# Patient Record
Sex: Male | Born: 1990 | Race: Black or African American | Hispanic: No | Marital: Single | Smoking: Current every day smoker
Health system: Southern US, Community
[De-identification: ages and names within clinical notes are randomized; demographics above are authoritative.]

---

## 2016-12-07 ENCOUNTER — Emergency Department (HOSPITAL_BASED_OUTPATIENT_CLINIC_OR_DEPARTMENT_OTHER): Payer: Self-pay

## 2016-12-07 ENCOUNTER — Encounter (HOSPITAL_BASED_OUTPATIENT_CLINIC_OR_DEPARTMENT_OTHER): Payer: Self-pay | Admitting: *Deleted

## 2016-12-07 ENCOUNTER — Emergency Department (HOSPITAL_BASED_OUTPATIENT_CLINIC_OR_DEPARTMENT_OTHER)
Admission: EM | Admit: 2016-12-07 | Discharge: 2016-12-07 | Disposition: A | Discharge status: Home / Self Care | Payer: Self-pay | Attending: Emergency Medicine | Admitting: Emergency Medicine

## 2016-12-07 DIAGNOSIS — R590 Localized enlarged lymph nodes: Secondary | ICD-10-CM

## 2016-12-07 DIAGNOSIS — R591 Generalized enlarged lymph nodes: Secondary | ICD-10-CM | POA: Insufficient documentation

## 2016-12-07 DIAGNOSIS — N50812 Left testicular pain: Secondary | ICD-10-CM | POA: Insufficient documentation

## 2016-12-07 DIAGNOSIS — F17200 Nicotine dependence, unspecified, uncomplicated: Secondary | ICD-10-CM | POA: Insufficient documentation

## 2016-12-07 LAB — CBC WITH DIFFERENTIAL/PLATELET
BASOS ABS: 0 10*3/uL (ref 0.0–0.1)
Basophils Relative: 0 %
EOS ABS: 0.2 10*3/uL (ref 0.0–0.7)
EOS PCT: 4 %
HCT: 42 % (ref 39.0–52.0)
HEMOGLOBIN: 14.5 g/dL (ref 13.0–17.0)
LYMPHS PCT: 29 %
Lymphs Abs: 1.9 10*3/uL (ref 0.7–4.0)
MCH: 28.6 pg (ref 26.0–34.0)
MCHC: 34.5 g/dL (ref 30.0–36.0)
MCV: 82.8 fL (ref 78.0–100.0)
Monocytes Absolute: 1.2 10*3/uL — ABNORMAL HIGH (ref 0.1–1.0)
Monocytes Relative: 18 %
Neutro Abs: 3.4 10*3/uL (ref 1.7–7.7)
Neutrophils Relative %: 49 %
PLATELETS: 190 10*3/uL (ref 150–400)
RBC: 5.07 MIL/uL (ref 4.22–5.81)
RDW: 13.1 % (ref 11.5–15.5)
WBC: 6.8 10*3/uL (ref 4.0–10.5)

## 2016-12-07 LAB — COMPREHENSIVE METABOLIC PANEL
ALT: 38 U/L (ref 17–63)
AST: 30 U/L (ref 15–41)
Albumin: 4.3 g/dL (ref 3.5–5.0)
Alkaline Phosphatase: 84 U/L (ref 38–126)
Anion gap: 12 (ref 5–15)
BUN: 11 mg/dL (ref 6–20)
CHLORIDE: 105 mmol/L (ref 101–111)
CO2: 20 mmol/L — ABNORMAL LOW (ref 22–32)
CREATININE: 0.82 mg/dL (ref 0.61–1.24)
Calcium: 9.2 mg/dL (ref 8.9–10.3)
GFR calc Af Amer: >60 mL/min (ref >60)
GFR calc non Af Amer: >60 mL/min (ref >60)
GLUCOSE: 82 mg/dL (ref 65–99)
Potassium: 3.9 mmol/L (ref 3.5–5.1)
SODIUM: 137 mmol/L (ref 135–145)
Total Bilirubin: 0.7 mg/dL (ref 0.3–1.2)
Total Protein: 8.1 g/dL (ref 6.5–8.1)

## 2016-12-07 LAB — URINALYSIS, ROUTINE W REFLEX MICROSCOPIC
BILIRUBIN URINE: NEGATIVE
GLUCOSE, UA: NEGATIVE mg/dL
Ketones, ur: NEGATIVE mg/dL
Leukocytes, UA: NEGATIVE
NITRITE: NEGATIVE
PH: 5.5 (ref 5.0–8.0)
Protein, ur: NEGATIVE mg/dL
SPECIFIC GRAVITY, URINE: 1.019 (ref 1.005–1.030)

## 2016-12-07 LAB — URINALYSIS, MICROSCOPIC (REFLEX): WBC UA: NONE SEEN WBC/hpf (ref 0–5)

## 2016-12-07 MED ORDER — CEFTRIAXONE SODIUM 250 MG IJ SOLR
250.0000 mg | Freq: Once | INTRAMUSCULAR | Status: AC
  Administered 2016-12-07: 250 mg via INTRAMUSCULAR
  Filled 2016-12-07: qty 250

## 2016-12-07 MED ORDER — IOPAMIDOL (ISOVUE-300) INJECTION 61%
100.0000 mL | Freq: Once | INTRAVENOUS | Status: AC | PRN
  Administered 2016-12-07: 100 mL via INTRAVENOUS

## 2016-12-07 MED ORDER — AZITHROMYCIN 250 MG PO TABS
1000.0000 mg | ORAL_TABLET | Freq: Once | ORAL | Status: AC
  Administered 2016-12-07: 1000 mg via ORAL
  Filled 2016-12-07: qty 4

## 2016-12-07 MED ORDER — KETOROLAC TROMETHAMINE 30 MG/ML IJ SOLN
30.0000 mg | Freq: Once | INTRAMUSCULAR | Status: AC
  Administered 2016-12-07: 30 mg via INTRAVENOUS
  Filled 2016-12-07: qty 1

## 2016-12-07 NOTE — ED Notes (Signed)
Patient transported to CT 

## 2016-12-07 NOTE — ED Provider Notes (Signed)
[facility]   CSN: [id number] Arrival date & time: 12/07/16  1732  By signing my name below, I, Joe Sanchez, attest that this documentation has been prepared under the direction and in the presence of Sharilyn SitesLisa Welford Christmas, PA-C. Electronically Signed: Angelene GiovanniEmmanuella Sanchez, ED Scribe. 12/07/16. 6:07 PM.   History   Chief Complaint Chief Complaint  Patient presents with  . Abdominal Pain    HPI Comments: Joe FootRodney Sanchez is a 25 y.o. male who presents to the Emergency Department complaining of gradually worsening moderate LLQ pain onset last night. He reports associated pain to his left testicle with a small mass which is where he believes his pain starts from. No alleviating or exacerbating factors noted. Pt has not tried any medications PTA. He has NKDA. He denies any concerns for an STD or hx of abdominal surgeries. He also denies any fever, chills, nausea, vomiting, urinary symptoms, penile discharge, generalized rash, or any other symptoms.   The history is provided by the patient. No language interpreter was used.    History reviewed. No pertinent past medical history.  There are no active problems to display for this patient.   History reviewed. No pertinent surgical history.     Home Medications    Prior to Admission medications   Not on File    Family History No family history on file.  Social History Social History  Substance Use Topics  . Smoking status: Current Every Day Smoker  . Smokeless tobacco: Never Used  . Alcohol use No     Allergies   Patient has no known allergies.   Review of Systems Review of Systems  Constitutional: Negative for chills and fever.  Gastrointestinal: Positive for abdominal pain. Negative for nausea and vomiting.  Genitourinary: Positive for testicular pain. Negative for discharge.  Skin: Negative for rash.  All other systems reviewed and are negative.    Physical Exam Updated Vital Signs BP  148/97   Pulse 110   Temp 98 F (36.7 C) (Oral)   Resp 16   Ht 5\' 6"  (1.676 m)   Wt 150 lb (68 kg)   SpO2 100%   BMI 24.21 kg/m   Physical Exam  Constitutional: He is oriented to person, place, and time. He appears well-developed and well-nourished.  HENT:  Head: Normocephalic and atraumatic.  Mouth/Throat: Oropharynx is clear and moist.  Eyes: Conjunctivae and EOM are normal. Pupils are equal, round, and reactive to light.  Neck: Normal range of motion.  Cardiovascular: Normal rate, regular rhythm and normal heart sounds.   Pulmonary/Chest: Effort normal and breath sounds normal.  Abdominal: Soft. Bowel sounds are normal. A hernia is present.  Genitourinary: Left testis shows tenderness. Circumcised. No penile erythema. No discharge found.  Genitourinary Comments: Some mild enlargement of the left pelvic region that is TTP, concerning for hernia; no appreciable lymphadenopathy; left testicle is mildly tender along lateral edge as well without visible swelling or mass; normal lie; no penile discharge noted  Musculoskeletal: Normal range of motion.  Neurological: He is alert and oriented to person, place, and time.  Skin: Skin is warm and dry.  Psychiatric: He has a normal mood and affect.  Nursing note and vitals reviewed.    ED Treatments / Results  DIAGNOSTIC STUDIES: Oxygen Saturation is 100% on RA, normal by my interpretation.    COORDINATION OF CARE: 6:03 PM- Pt advised of plan for treatment and pt agrees. Pt will receive Toradol. He will also receive US scrotum, US doppler,  and lab work for further evaluation.    Labs (all labs ordered are listed, but only abnormal results are displayed) Labs Reviewed  CBC WITH DIFFERENTIAL/PLATELET - Abnormal; Notable for the following:       Result Value   Monocytes Absolute 1.2 (*)    All other components within normal limits  COMPREHENSIVE METABOLIC PANEL - Abnormal; Notable for the following:    CO2 20 (*)    All other  components within normal limits  URINALYSIS, ROUTINE W REFLEX MICROSCOPIC - Abnormal; Notable for the following:    Hgb urine dipstick TRACE (*)    All other components within normal limits  URINALYSIS, MICROSCOPIC (REFLEX) - Abnormal; Notable for the following:    Bacteria, UA RARE (*)    Squamous Epithelial / LPF 0-5 (*)    All other components within normal limits  GC/CHLAMYDIA PROBE AMP ([place]) NOT AT [facility])    EKG  EKG Interpretation None       Radiology US Scrotum  Result Date: 12/07/2016 CLINICAL DATA:  Left inguinal canal pain and left testicle pain. EXAM: ULTRASOUND OF SCROTUM SCROTAL DOPPLER TECHNIQUE: Complete ultrasound examination of the testicles, epididymis, and other scrotal structures was performed. Doppler interrogation of the testicles was performed bile leak. COMPARISON:  None. FINDINGS: Right testicle Measurements: 4.4 x 2.0 x 2.7 cm. No mass or microlithiasis visualized. Left testicle Measurements: 4.0 x 2.2 x 2.7 cm. No mass or microlithiasis visualized. Right epididymis:  Normal in size and appearance. Left epididymis:  Normal in size and appearance. Hydrocele:  Small left hydrocele inferiorly Varicocele:  None visualized. Doppler: Normal low resistance arterial and venous blood flow noted bilaterally. Other: There is abnormal soft tissue within the left inguinal canal extending into the inferior left scrotum, most compatible with inguinal hernia. It is concerning that this contains bowel. IMPRESSION: Abnormal soft tissue within the left inguinal canal and left scrotum compatible with left inguinal hernia. Appearance is concerning for containing bowel. No testicular abnormality. Electronically Signed   By: Charlett Nose M.D.   On: 12/07/2016 18:59   Ct Abdomen Pelvis W Contrast  Result Date: 12/07/2016 CLINICAL DATA:  Left groin pain since last night. Abnormal soft tissue within the left inguinal canal and left scrotum on a scrotal ultrasound earlier today, with  a concern for a left inguinal hernia containing bowel. EXAM: CT ABDOMEN AND PELVIS WITH CONTRAST TECHNIQUE: Multidetector CT imaging of the abdomen and pelvis was performed using the standard protocol following bolus administration of intravenous contrast. CONTRAST:  ISOVUE-300 IOPAMIDOL (ISOVUE-300) INJECTION 61% COMPARISON:  None. FINDINGS: Lower chest: Minimal right middle lobe atelectasis or scarring. Hepatobiliary: Mild diffuse low density of the liver relative to the spleen. Normal appearing gallbladder. Pancreas: Unremarkable. No pancreatic ductal dilatation or surrounding inflammatory changes. Spleen: Normal in size without focal abnormality. Adrenals/Urinary Tract: Adrenal glands are unremarkable. Kidneys are normal, without renal calculi, focal lesion, or hydronephrosis. Bladder is unremarkable. Stomach/Bowel: Stomach is within normal limits. Appendix appears normal. No evidence of bowel wall thickening, distention, or inflammatory changes. Vascular/Lymphatic: Prominent right common iliac lymph node with a short axis diameter of 8 mm on image number 57 of series 2. Prominent right internal iliac lymph node with a short axis diameter of 9 mm on image number 66 of series 2. Prominent bilateral obturator nodes with a short axis diameter of 6 mm on the right and 6 mm on the left on image number 71 of series 2. Prominent bilateral common femoral nodes, with a short  axis diameter of 7 mm on the right and 6 mm on the left on image number 69 of series 2. Prominent bilateral inguinal lymph nodes, the largest with a short axis diameter of 8 mm on the right and 9 mm on the left on image number 82 of series 2. No vascular abnormalities. Reproductive: Normal sized prostate gland. Other: No inguinal hernia is demonstrated on either side. The scrotal contents appear normal, with the exception of mildly prominent vessels in the left scrotum and inguinal canal, without a discrete varicocele or mass identified.  Musculoskeletal: Normal appearing bones and soft tissues. IMPRESSION: 1. No inguinal hernia seen. 2. Unremarkable scrotal contents by CT, with the exception of mildly prominent vessels on the left. 3. Borderline bilateral pelvic and inguinal adenopathy, most likely reactive. Electronically Signed   By: Beckie SaltsSteven  Reid M.D.   On: 12/07/2016 19:50   [place] Art/ven Flow Abd Pelv Doppler  Result Date: 12/07/2016 CLINICAL DATA:  Left inguinal canal pain and left testicle pain. EXAM: ULTRASOUND OF SCROTUM SCROTAL DOPPLER TECHNIQUE: Complete ultrasound examination of the testicles, epididymis, and other scrotal structures was performed. Doppler interrogation of the testicles was performed bile leak. COMPARISON:  None. FINDINGS: Right testicle Measurements: 4.4 x 2.0 x 2.7 cm. No mass or microlithiasis visualized. Left testicle Measurements: 4.0 x 2.2 x 2.7 cm. No mass or microlithiasis visualized. Right epididymis:  Normal in size and appearance. Left epididymis:  Normal in size and appearance. Hydrocele:  Small left hydrocele inferiorly Varicocele:  None visualized. Doppler: Normal low resistance arterial and venous blood flow noted bilaterally. Other: There is abnormal soft tissue within the left inguinal canal extending into the inferior left scrotum, most compatible with inguinal hernia. It is concerning that this contains bowel. IMPRESSION: Abnormal soft tissue within the left inguinal canal and left scrotum compatible with left inguinal hernia. Appearance is concerning for containing bowel. No testicular abnormality. Electronically Signed   By: Charlett NoseKevin  Dover M.D.   On: 12/07/2016 18:59    Procedures Procedures (including critical care time)  Medications Ordered in ED Medications - No data to display   Initial Impression / Assessment and Plan / ED Course  Sharilyn SitesLisa Zayne Draheim, PA-C has reviewed the triage vital signs and the nursing notes.  Pertinent labs & imaging results that were available during my care of the  patient were reviewed by me and considered in my medical decision making (see chart for details).  Clinical Course    25 year old male here with left lower pelvic pain which began last night. On exam there is some enlargement of the pelvic region is tender to palpation, concerning for hernia. No appreciable lymphadenopathy. Also has some tenderness left lateral testicle without noted masses. Normal lie. No penile discharge. Labwork is overall reassuring. UA with rare bacteria.  US was obtained, concerning for hernia containing small bowel.  CT scan obtained without evidence of hernia, rather lymphadenopathy.  Patient adamantly denies any new sexual partners or concern for STD at this time. Gc/Chl pending.  He was treated empirically here with rocephin/azithromycin.  He has been eating and drinking here without difficulty.  Pain reduced with IV toradol.  Recommended to follow-up with urology for ongoing symptoms.  May also follow-up with health dept as he does not have PCP.  Discussed plan with patient, he acknowledged understanding and agreed with plan of care.  Return precautions given for new or worsening symptoms.  Final Clinical Impressions(s) / ED Diagnoses   Final diagnoses:  Pain in left testicle  Pelvic  lymphadenopathy    New Prescriptions New Prescriptions   No medications on file   I personally performed the services described in this documentation, which was scribed in my presence. The recorded information has been reviewed and is accurate.   Garlon Hatchet, PA-C 12/07/16 2146    Raeford Razor, MD 12/11/16 1115

## 2016-12-07 NOTE — Discharge Instructions (Signed)
As we discussed, your CT scan was concerning for enlarged lymph nodes in your pelvis. You were given antibiotics here.  We did sent cultures which will return in 48-72 hours.  You will be contact if these are positive. Follow-up with the urologist. Return to the ED for new or worsening symptoms.

## 2016-12-07 NOTE — ED Triage Notes (Signed)
Left lower quadrant pain since last night.

## 2016-12-10 LAB — GC/CHLAMYDIA PROBE AMP (~~LOC~~) NOT AT ARMC
Chlamydia: NEGATIVE
NEISSERIA GONORRHEA: POSITIVE — AB

## 2017-03-13 IMAGING — US US ART/VEN ABD/PELV/SCROTUM DOPPLER LTD
1 series · 14 of 25 positions shown · non-contrast
Comparison: None.

CLINICAL DATA: Left inguinal canal pain and left testicle pain.

EXAM:
ULTRASOUND OF SCROTUM
SCROTAL DOPPLER
TECHNIQUE: Complete ultrasound examination of the testicles, epididymis, and
other scrotal structures was performed. Doppler interrogation of the
testicles was performed bile leak.

[Series 1: us art/ven abd/pelv/scrotum doppler ltd · 0.07mm/px · 14 of 73 slices shown]
[im 1/73]
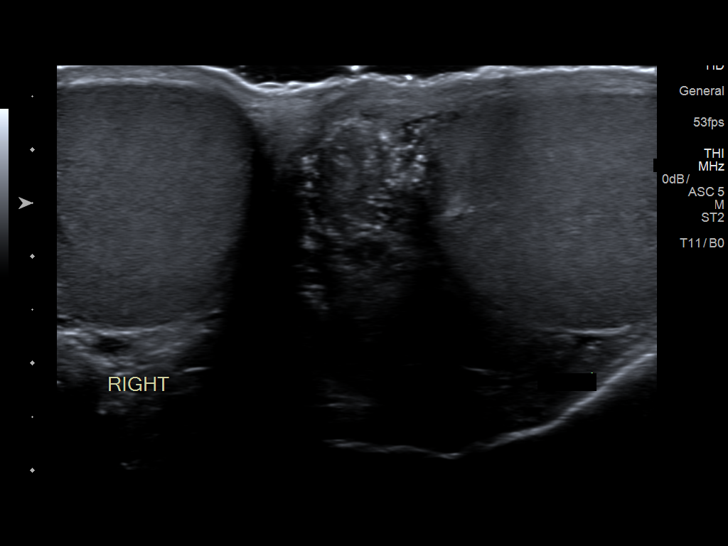
[im 7/73]
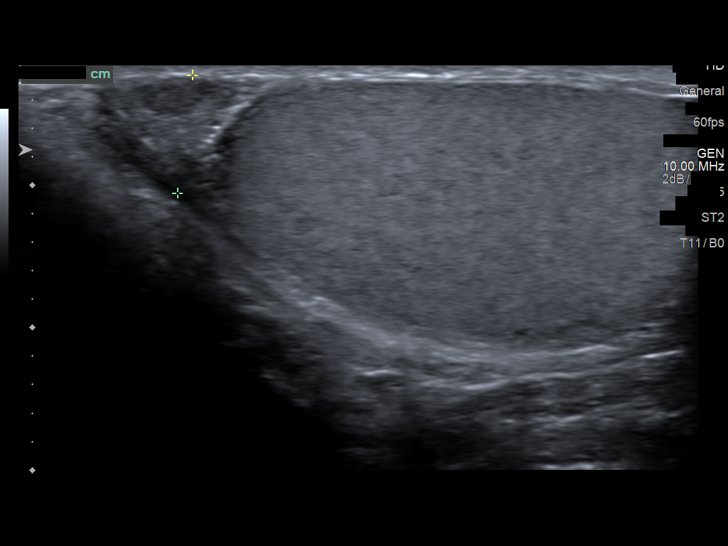
[im 13/73]
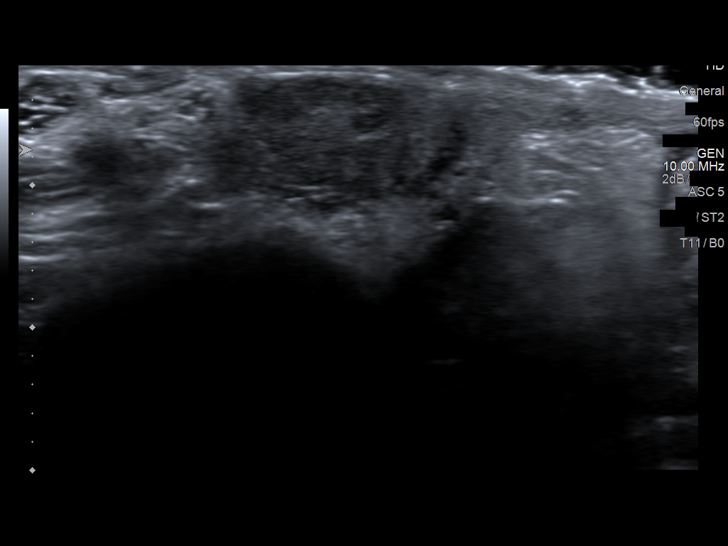
[im 19/73]
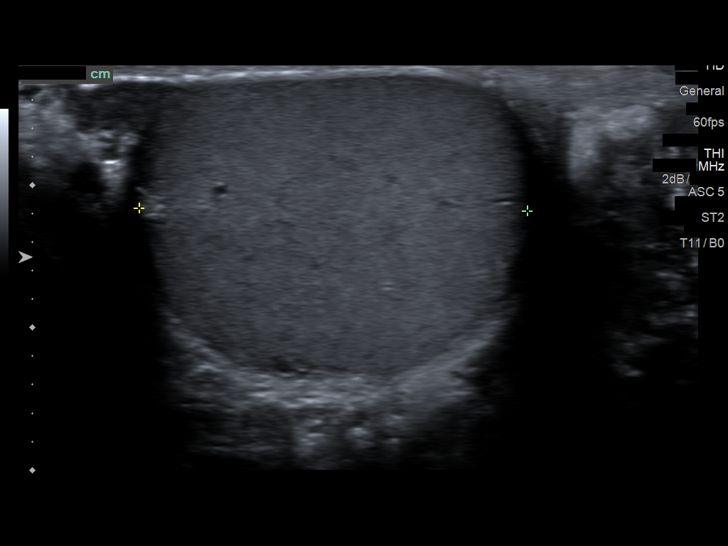
[im 25/73]
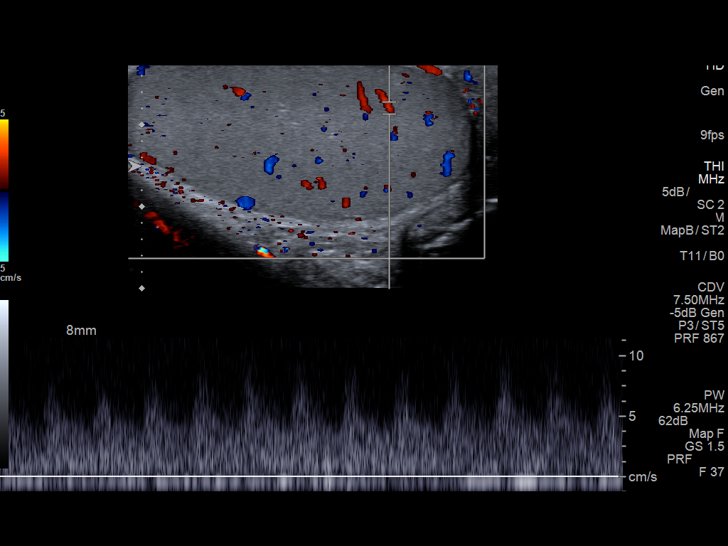
[im 28/73]
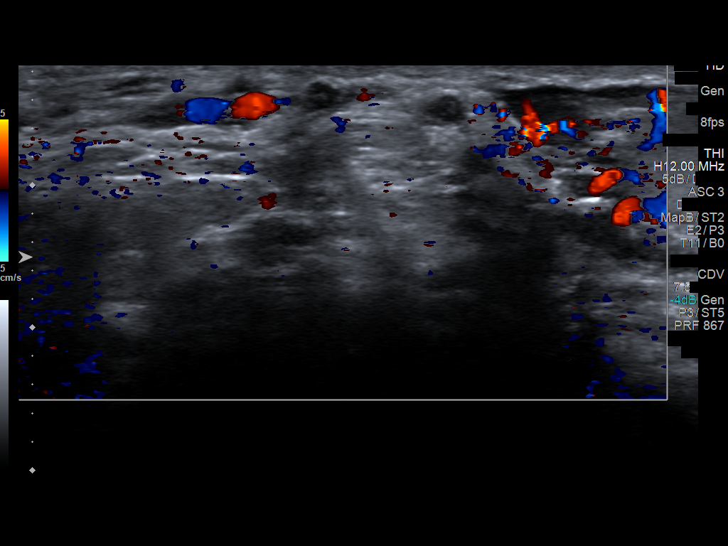
[im 34/73]
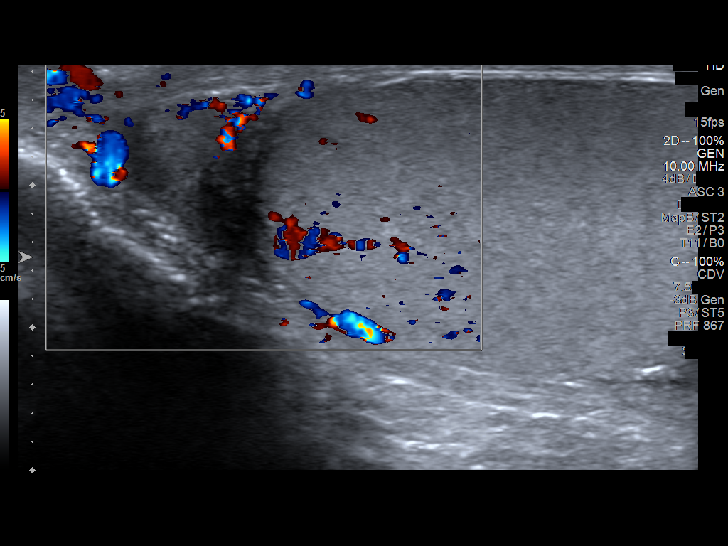
[im 40/73]
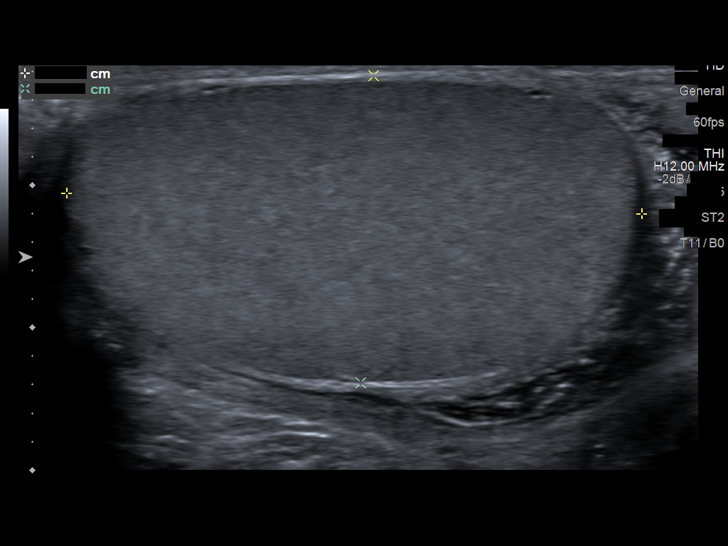
[im 46/73]
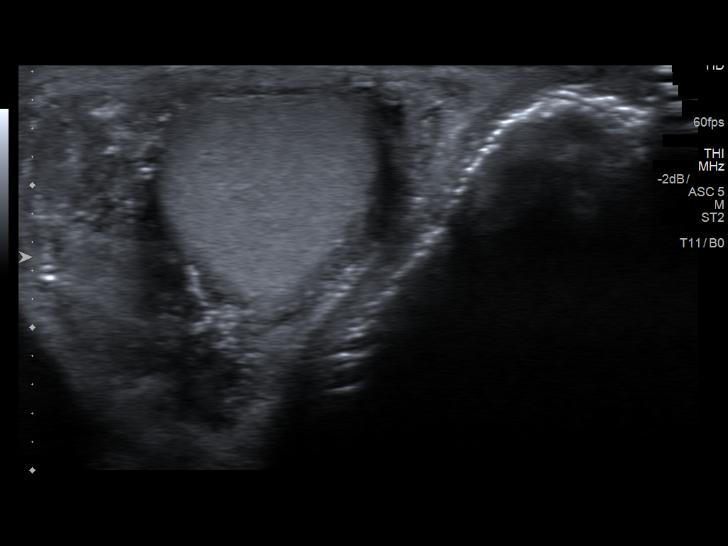
[im 49/73]
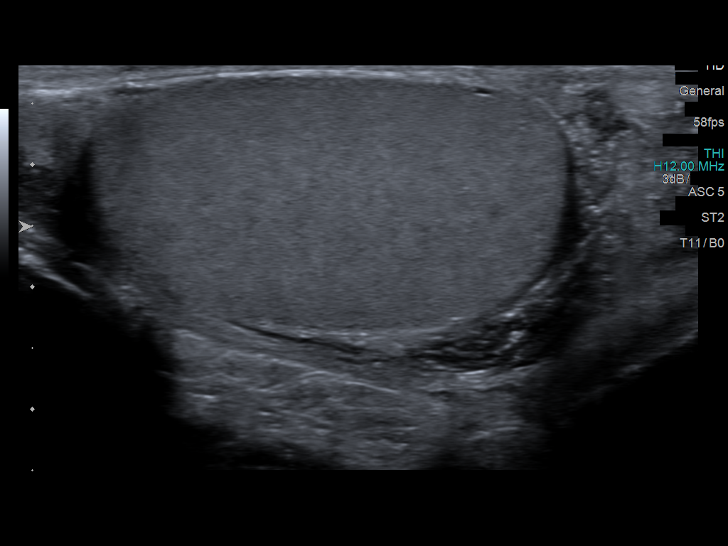
[im 55/73]
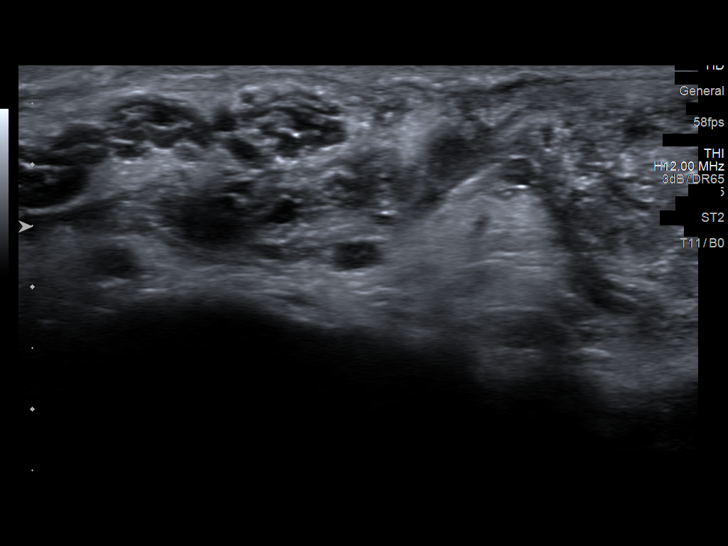
[im 61/73]
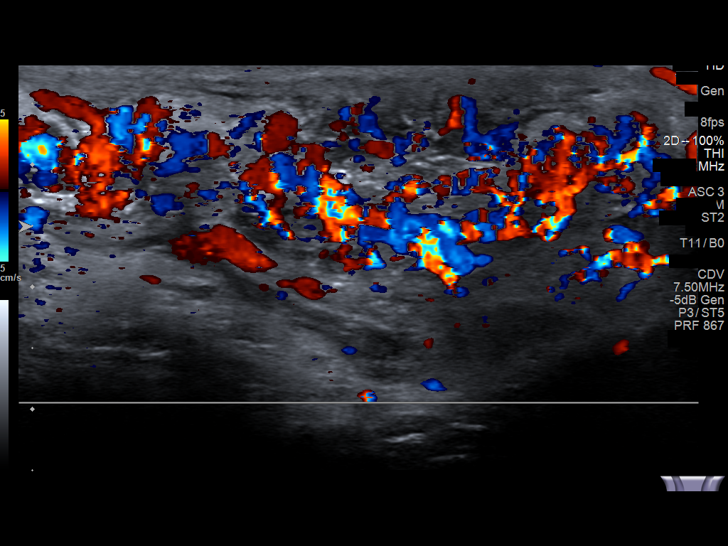
[im 67/73]
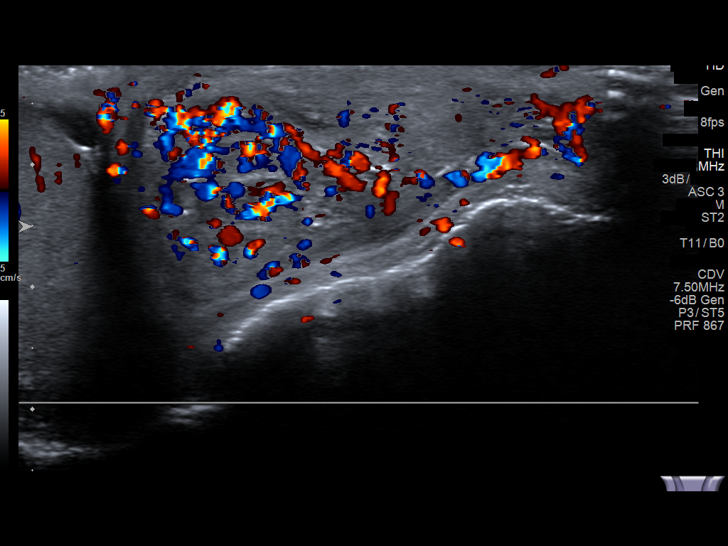
[im 73/73]
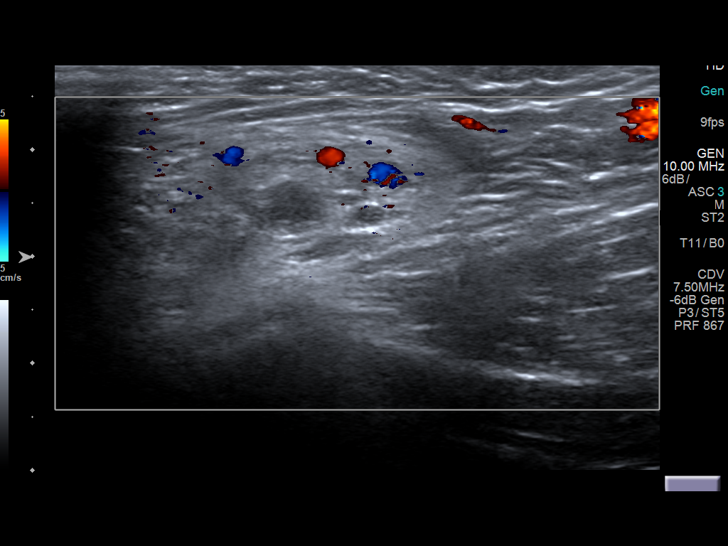

[14 of 25 positions shown; findings below may reference images not displayed]

FINDINGS: Right testicle

Measurements: 4.4 x 2.0 x 2.7 cm. No mass or microlithiasis
visualized.

Left testicle

Measurements: 4.0 x 2.2 x 2.7 cm. No mass or microlithiasis
visualized.

Right epididymis:  Normal in size and appearance.

Left epididymis:  Normal in size and appearance.

Hydrocele:  Small left hydrocele inferiorly

Varicocele:  None visualized.

Doppler: Normal low resistance arterial and venous blood flow noted
bilaterally.

Other: There is abnormal soft tissue within the left inguinal canal
extending into the inferior left scrotum, most compatible with
inguinal hernia. It is concerning that this contains bowel.
IMPRESSION: Abnormal soft tissue within the left inguinal canal and left scrotum
compatible with left inguinal hernia. Appearance is concerning for
containing bowel.

No testicular abnormality.

## 2017-06-19 ENCOUNTER — Encounter (HOSPITAL_BASED_OUTPATIENT_CLINIC_OR_DEPARTMENT_OTHER): Payer: Self-pay

## 2017-06-19 ENCOUNTER — Emergency Department (HOSPITAL_BASED_OUTPATIENT_CLINIC_OR_DEPARTMENT_OTHER)
Admission: EM | Admit: 2017-06-19 | Discharge: 2017-06-19 | Disposition: A | Discharge status: Home / Self Care | Payer: Self-pay | Attending: Emergency Medicine | Admitting: Emergency Medicine

## 2017-06-19 DIAGNOSIS — H65195 Other acute nonsuppurative otitis media, recurrent, left ear: Secondary | ICD-10-CM | POA: Insufficient documentation

## 2017-06-19 DIAGNOSIS — H65192 Other acute nonsuppurative otitis media, left ear: Secondary | ICD-10-CM

## 2017-06-19 DIAGNOSIS — H6123 Impacted cerumen, bilateral: Secondary | ICD-10-CM | POA: Insufficient documentation

## 2017-06-19 DIAGNOSIS — F17290 Nicotine dependence, other tobacco product, uncomplicated: Secondary | ICD-10-CM | POA: Insufficient documentation

## 2017-06-19 MED ORDER — DOCUSATE SODIUM 100 MG PO CAPS
100.0000 mg | ORAL_CAPSULE | Freq: Once | ORAL | Status: AC
  Filled 2017-06-19: qty 1

## 2017-06-19 MED ORDER — DOCUSATE SODIUM 50 MG/5ML PO LIQD
50.0000 mg | Freq: Once | ORAL | Status: AC
  Administered 2017-06-19: 50 mg via OTIC

## 2017-06-19 MED ORDER — DOCUSATE SODIUM 50 MG/5ML PO LIQD
ORAL | Status: AC
  Administered 2017-06-19: 50 mg via OTIC
  Filled 2017-06-19: qty 10

## 2017-06-19 MED ORDER — IBUPROFEN 400 MG PO TABS
600.0000 mg | ORAL_TABLET | Freq: Once | ORAL | Status: AC
  Administered 2017-06-19: 600 mg via ORAL
  Filled 2017-06-19: qty 1

## 2017-06-19 MED ORDER — AMOXICILLIN 500 MG PO CAPS: 500.0000 mg | ORAL_CAPSULE | Freq: Two times a day (BID) | ORAL | 0 refills | Status: AC

## 2017-06-19 NOTE — ED Triage Notes (Signed)
C/o left earache-NAD-steady gait

## 2017-06-19 NOTE — ED Provider Notes (Signed)
[facility]   CSN: [id number] Arrival date & time: 06/19/17  1601  By signing my name below, I, Joe Sanchez, attest that this documentation has been prepared under the direction and in the presence of non-physician practitioner, [place] Russo, PA-C. Electronically Signed: Modena Sanchez, Scribe. 06/19/2017. 4:16 PM.  History   Chief Complaint Chief Complaint  Patient presents with  . Otalgia   The history is provided by the patient. No language interpreter was used.   HPI Comments: Joe Sanchez is a 26 y.o. male who presents to the Emergency Department complaining of constant moderate left ear pain that started last night. He states his pain started all of a sudden last night while at rest. He reports every time he burps he hears a pop with associated intermittent trouble hearing. He describes the pain as aching, with no treatments tried PTA. No history of TM perforation. Denies congestion, sore throat, cough nausea, dizziness, headache, or other complaints at this time.   History reviewed. No pertinent past medical history.  There are no active problems to display for this patient.   History reviewed. No pertinent surgical history.     Home Medications    Prior to Admission medications   Medication Sig Start Date End Date Taking? Authorizing Provider  amoxicillin (AMOXIL) 500 MG capsule Take 1 capsule (500 mg total) by mouth 2 (two) times daily. 06/19/17 06/26/17  Russo, [place] N, PA-C    Family History No family history on file.  Social History Social History  Substance Use Topics  . Smoking status: Current Every Day Smoker    Types: Cigars  . Smokeless tobacco: Never Used  . Alcohol use Yes     Comment: occ     Allergies   Patient has no known allergies.   Review of Systems Review of Systems  HENT: Positive for ear pain (left) and hearing loss. Negative for congestion and sore throat.   Gastrointestinal: Negative for nausea.    Neurological: Negative for dizziness and headaches.    Physical Exam Updated Vital Signs BP 119/83 (BP Location: Left Arm)   Pulse 70   Temp 97.6 F (36.4 C) (Oral)   Resp 18   Ht 5\' 7"  (1.702 m)   Wt 81.2 kg (179 lb)   SpO2 100%   BMI 28.04 kg/m    Physical Exam  Constitutional: He appears well-developed and well-nourished.  HENT:  Head: Normocephalic and atraumatic.  Left Ear: Tympanic membrane is not perforated.  Nose: Nose normal.  Mouth/Throat: Uvula is midline, oropharynx is clear and moist and mucous membranes are normal. No trismus in the jaw. No uvula swelling.  Initial Eval: Cerumen impaction in the left ear.  Right ear has partial cerumen impaction. Able to visualize the superior portion of the TM, which appears normal. No mastoid tenderness  Re-eval: Left TM is bulging and erythematous  Eyes: Conjunctivae are normal.  Neck: Normal range of motion. Neck supple.  Cardiovascular: Normal rate, regular rhythm, normal heart sounds and intact distal pulses.   Pulmonary/Chest: Effort normal and breath sounds normal. No respiratory distress.  Lymphadenopathy:    He has no cervical adenopathy.  Psychiatric: He has a normal mood and affect. His behavior is normal.  Nursing note and vitals reviewed.    ED Treatments / Results  DIAGNOSTIC STUDIES: Oxygen Saturation is 100% on RA, normal by my interpretation.    COORDINATION OF CARE: 4:20 PM- Pt advised of plan for treatment and pt agrees.  Labs (all labs  ordered are listed, but only abnormal results are displayed) Labs Reviewed - No data to display  EKG  EKG Interpretation None       Radiology No results found.  Procedures .Ear Cerumen Removal Date/Time: 06/19/2017 5:45 PM Performed by: RUSSO, [place]JORDAN N Authorized by: RUSSO, [place]ORDAN N   Consent:    Consent obtained:  Verbal   Consent given by:  Patient   Risks discussed:  Dizziness, incomplete removal, TM perforation and pain   Alternatives  discussed:  No treatment and alternative treatment Procedure details:    Location:  L ear   Procedure type: curette   Post-procedure details:    Inspection:  TM intact   Hearing quality:  Normal   Patient tolerance of procedure:  Tolerated well, no immediate complications (pt having pain)    (including critical care time)  Medications Ordered in ED Medications  ibuprofen (ADVIL,MOTRIN) tablet 600 mg (600 mg Oral Given 06/19/17 1635)  docusate sodium (COLACE) capsule 100 mg ([facility] mg [facility]/19[facility]17 1706)  docusate (COLACE) 50 MG/5ML liquid 50 mg (50 mg Left EAR Given by Other 06/19/17 1710)     Initial Impression / Assessment and Plan / ED Course  I have reviewed the triage vital signs and the nursing notes.  Pertinent labs & imaging results that were available during my care of the patient were reviewed by me and considered in my medical decision making (see chart for details).     Patient presents with otalgia. Pain improved w Advil. Cerumen Impaction on initial evaluation. Cerumen removed successfully and exam consistent with acute otitis media. Hearing with full improvement after cerumen removal. No concern for acute mastoiditis, meningitis.  No antibiotic use in the last month.  Patient discharged home with Amoxicillin. Advil for pain. Pt is well-appearing, safe for discharge.  Patient discussed with Dr. Fayrene FearingJames.  Discussed results, findings, treatment and follow up. Patient advised of return precautions. Patient verbalized understanding and agreed with plan.  Final Clinical Impressions(s) / ED Diagnoses   Final diagnoses:  Bilateral impacted cerumen  Acute otitis media with effusion of left ear    New Prescriptions Discharge Medication List as of 06/19/2017  6:08 PM    START taking these medications   Details  amoxicillin (AMOXIL) 500 MG capsule Take 1 capsule (500 mg total) by mouth 2 (two) times daily., Starting Wed 06/19/2017, Until Wed 06/26/2017, Print        I personally performed the services described in this documentation, which was scribed in my presence. The recorded information has been reviewed and is accurate.     Russo, [place] N, PA-C 06/19/17 1846    Joe Sanchez, Mark, MD 06/28/17 [phone]

## 2017-06-19 NOTE — Discharge Instructions (Signed)
Please read instructions below.  You can take advil every 6 hours as needed for pain.  Take the antibiotic, Amoxicillin, 2 times per day until gone. Drink plenty of water.  Follow up with your primary care provider as needed.  Return to the ER for hearing loss, or new or worsening symptoms.

## 2017-09-20 ENCOUNTER — Emergency Department (HOSPITAL_BASED_OUTPATIENT_CLINIC_OR_DEPARTMENT_OTHER): Payer: Self-pay

## 2017-09-20 ENCOUNTER — Encounter (HOSPITAL_BASED_OUTPATIENT_CLINIC_OR_DEPARTMENT_OTHER): Payer: Self-pay | Admitting: *Deleted

## 2017-09-20 DIAGNOSIS — F17290 Nicotine dependence, other tobacco product, uncomplicated: Secondary | ICD-10-CM | POA: Insufficient documentation

## 2017-09-20 DIAGNOSIS — F17228 Nicotine dependence, chewing tobacco, with other nicotine-induced disorders: Secondary | ICD-10-CM | POA: Insufficient documentation

## 2017-09-20 DIAGNOSIS — L308 Other specified dermatitis: Secondary | ICD-10-CM | POA: Insufficient documentation

## 2017-09-20 DIAGNOSIS — B009 Herpesviral infection, unspecified: Secondary | ICD-10-CM | POA: Insufficient documentation

## 2017-09-20 DIAGNOSIS — B028 Zoster with other complications: Secondary | ICD-10-CM | POA: Insufficient documentation

## 2017-09-20 NOTE — ED Triage Notes (Signed)
C/o R shoulder pain, shooting through whole arm, also some neck discomfort, onset 2d ago, no h/o same, (denies: injury, weakness, numbness or tingling, nausea, dizziness, sob, or back pain), mentions vomited x1 at ~1900. No relief with tylenol.

## 2017-09-21 ENCOUNTER — Other Ambulatory Visit: Payer: Self-pay

## 2017-09-21 ENCOUNTER — Emergency Department (HOSPITAL_BASED_OUTPATIENT_CLINIC_OR_DEPARTMENT_OTHER)
Admission: EM | Admit: 2017-09-21 | Discharge: 2017-09-21 | Disposition: A | Discharge status: Home / Self Care | Payer: Self-pay | Attending: Emergency Medicine | Admitting: Emergency Medicine

## 2017-09-21 DIAGNOSIS — B028 Zoster with other complications: Secondary | ICD-10-CM

## 2017-09-21 DIAGNOSIS — L308 Other specified dermatitis: Secondary | ICD-10-CM

## 2017-09-21 DIAGNOSIS — B009 Herpesviral infection, unspecified: Secondary | ICD-10-CM

## 2017-09-21 MED ORDER — NAPROXEN 250 MG PO TABS
500.0000 mg | ORAL_TABLET | Freq: Once | ORAL | Status: AC
  Administered 2017-09-21: 500 mg via ORAL
  Filled 2017-09-21: qty 2

## 2017-09-21 MED ORDER — ACYCLOVIR 200 MG PO CAPS
400.0000 mg | ORAL_CAPSULE | Freq: Once | ORAL | Status: AC
  Administered 2017-09-21: 400 mg via ORAL
  Filled 2017-09-21: qty 2

## 2017-09-21 MED ORDER — ACYCLOVIR 800 MG PO TABS: 800.0000 mg | ORAL_TABLET | Freq: Every day | ORAL | 0 refills | Status: AC

## 2017-09-21 MED ORDER — NAPROXEN 500 MG PO TABS: 500.0000 mg | ORAL_TABLET | Freq: Two times a day (BID) | ORAL | 0 refills | Status: AC

## 2017-09-21 NOTE — ED Provider Notes (Signed)
[facility]   CSN: [id number] Arrival date & time: 09/20/17  2242     History   Chief Complaint Chief Complaint  Patient presents with  . Shoulder Pain    HPI Joe Barriere is a 26 y.o. male.  The history is provided by the patient.  Shoulder Pain   This is a new problem. The current episode started more than 2 days ago. The problem occurs constantly. The problem has not changed since onset.The pain is present in the right shoulder. The quality of the pain is described as sharp and constant. The pain is moderate. Pertinent negatives include no stiffness and no itching. He has tried nothing for the symptoms. The treatment provided no relief. There has been no history of extremity trauma. Family history is significant for no rheumatoid arthritis.    History reviewed. No pertinent past medical history.  There are no active problems to display for this patient.   History reviewed. No pertinent surgical history.     Home Medications    Prior to Admission medications   Medication Sig Start Date End Date Taking? Authorizing Provider  acyclovir (ZOVIRAX) 800 MG tablet Take 1 tablet (800 mg total) by mouth 5 (five) times daily. 09/21/17   Lacy Taglieri, MD  naproxen (NAPROSYN) 500 MG tablet Take 1 tablet (500 mg total) by mouth 2 (two) times daily. 09/21/17   Jamaris Theard, MD    Family History History reviewed. No pertinent family history.  Social History Social History  Substance Use Topics  . Smoking status: Current Every Day Smoker    Types: Cigars  . Smokeless tobacco: Current User    Types: Chew  . Alcohol use Yes     Comment: occ     Allergies   Patient has no known allergies.   Review of Systems Review of Systems  Constitutional: Negative for fever.  Genitourinary: Negative for genital sores.  Musculoskeletal: Positive for arthralgias. Negative for stiffness.  Skin: Negative for itching.  All other systems reviewed and are  negative.    Physical Exam Updated Vital Signs BP (!) 127/96 (BP Location: Right Arm)   Pulse 78   Temp 97.9 F (36.6 C) (Oral)   Resp 16   Ht  (1.702 m)   Wt 77.1 kg (170 lb)   SpO2 100%   BMI 26.63 kg/m   Physical Exam  Constitutional: He is oriented to person, place, and time. He appears well-developed and well-nourished. No distress.  HENT:  Head: Normocephalic and atraumatic.  Mouth/Throat: No oropharyngeal exudate.  Eyes: Pupils are equal, round, and reactive to light. Conjunctivae are normal.  Neck: Normal range of motion. Neck supple.  Cardiovascular: Normal rate, regular rhythm, normal heart sounds and intact distal pulses.   Pulmonary/Chest: Effort normal and breath sounds normal. No respiratory distress.  Abdominal: Soft. Bowel sounds are normal. There is no tenderness.  Musculoskeletal: Normal range of motion.  Neurological: He is alert and oriented to person, place, and time.  Skin: Skin is warm and dry.  Clusters of vesicles on in multiple area along a dermatoma starting at the spine of the right scapula to the shoulder joint      ED Treatments / Results  Labs (all labs ordered are listed, but only abnormal results are displayed) Labs Reviewed - No data to display  EKG  EKG Interpretation  Date/Time:  Saturday September 21 2017 01:06:12 EDT Ventricular Rate:  71 PR Interval:    QRS Duration: 89 QT Interval:  358 QTC Calculation: 389 R Axis:   48 Text Interpretation:  Sinus rhythm Confirmed by Kamarian Sahakian ([id number]) on 09/21/2017 1:56:56 AM       Radiology Dg Shoulder Right  Result Date: 09/21/2017 CLINICAL DATA:  26 y/o  M; acute onset of right shoulder pain. EXAM: RIGHT SHOULDER - 2+ VIEW COMPARISON:  None. FINDINGS: There is no evidence of fracture or dislocation. 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 There is no evidence of arthropathy or other focal bone abnormality. Soft tissues are  unremarkable. 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 Otherwise negative. Electronically Signed   By: Mitzi Hansen M.D.   On: 09/21/2017 00:13    Procedures Procedures (including critical care time)  Medications Ordered in ED Medications  acyclovir (ZOVIRAX) 200 MG capsule 400 mg (400 mg Oral Given 09/21/17 0232)  naproxen (NAPROSYN) tablet 500 mg (500 mg Oral Given 09/21/17 0232)      Final Clinical Impressions(s) / ED Diagnoses   Final diagnoses:  Herpes infection  Herpes zoster dermatitis   While the clustering is somewhat irregular for zoster, I suspect the fact that the groups of vesicles are trailing down a dermatome is consistent with zoster.  Will treat for same.  FOllow up with your PMD for recheck.    Shortness of breath, swelling or the lips or tongue, chest pain, dyspnea on exertion, new weakness or numbness changes in vision or speech,  Inability to tolerate liquids or food, changes in voice cough, altered mental status or any concerns. No signs of systemic illness or infection. The patient is nontoxic-appearing on exam and vital signs are within normal limits.    I have reviewed the triage vital signs and the nursing notes. Pertinent labs &imaging results that were available during my care of the patient were reviewed by me and considered in my medical decision making (see chart for details).  After history, exam, and medical workup I feel the patient has been appropriately medically screened and is safe for discharge home. Pertinent diagnoses were discussed with the patient. Patient was given return precautions.         New Prescriptions Discharge Medication List as of 09/21/2017  2:22 AM    START taking these medications   Details  acyclovir (ZOVIRAX) 800 MG tablet Take 1 tablet (800 mg total) by mouth 5 (five) times daily., Starting Sat 09/21/2017, Print    naproxen (NAPROSYN) 500 MG  tablet Take 1 tablet (500 mg total) by mouth 2 (two) times daily., Starting Sat 09/21/2017, Print         Damaria Vachon, MD 09/21/17 [phone]

## 2017-11-27 ENCOUNTER — Ambulatory Visit: Payer: Self-pay

## 2017-11-27 ENCOUNTER — Other Ambulatory Visit: Payer: Self-pay

## 2017-12-09 ENCOUNTER — Ambulatory Visit: Payer: Self-pay | Admitting: Infectious Diseases

## 2017-12-09 ENCOUNTER — Ambulatory Visit: Payer: Self-pay
